# Patient Record
Sex: Female | Born: 1950 | Race: Black or African American | Hispanic: No | Marital: Single | Smoking: Former smoker
Health system: Southern US, Community
[De-identification: ages and names within clinical notes are randomized; demographics above are authoritative.]

## PROBLEM LIST (undated history)

## (undated) DIAGNOSIS — M109 Gout, unspecified: Secondary | ICD-10-CM

## (undated) DIAGNOSIS — I10 Essential (primary) hypertension: Secondary | ICD-10-CM

## (undated) HISTORY — PX: BREAST SURGERY: SHX581

## (undated) HISTORY — PX: TUBAL LIGATION: SHX77

---

## 2016-08-10 ENCOUNTER — Emergency Department (INDEPENDENT_AMBULATORY_CARE_PROVIDER_SITE_OTHER): Payer: Medicare PPO

## 2016-08-10 ENCOUNTER — Emergency Department
Admission: EM | Admit: 2016-08-10 | Discharge: 2016-08-10 | Disposition: A | Discharge status: Home / Self Care | Payer: Medicare PPO | Source: Home / Self Care | Attending: Family Medicine | Admitting: Family Medicine

## 2016-08-10 ENCOUNTER — Encounter: Payer: Self-pay | Admitting: *Deleted

## 2016-08-10 DIAGNOSIS — M79671 Pain in right foot: Secondary | ICD-10-CM

## 2016-08-10 DIAGNOSIS — S99921A Unspecified injury of right foot, initial encounter: Secondary | ICD-10-CM

## 2016-08-10 DIAGNOSIS — L03115 Cellulitis of right lower limb: Secondary | ICD-10-CM

## 2016-08-10 DIAGNOSIS — M25474 Effusion, right foot: Secondary | ICD-10-CM

## 2016-08-10 HISTORY — DX: Gout, unspecified: M10.9

## 2016-08-10 HISTORY — DX: Essential (primary) hypertension: I10

## 2016-08-10 MED ORDER — CEPHALEXIN 500 MG PO CAPS: 500.0000 mg | ORAL_CAPSULE | Freq: Two times a day (BID) | ORAL | 0 refills | Status: DC

## 2016-08-10 NOTE — ED Triage Notes (Signed)
Patient reports dropping an unknown object on the top of her right foot 4 days ago. Redness and swelling present. Taken Advil.

## 2016-08-10 NOTE — ED Provider Notes (Signed)
CSN: [id number]     Arrival date & time 08/10/16  1500 History   First MD Initiated Contact with Patient 08/10/16 1534     Chief Complaint  Patient presents with  . Foot Injury  . Redness/Swelling   (Consider location/radiation/quality/duration/timing/severity/associated sxs/prior Treatment) HPI  Kathleen AcheBarbara Paredez is a 66 y.o. female presenting to UC with c/o Right foot pain for about 4 days after dropping something on it while carrying a few things in her garage. She does not recall what she dropped on it but her foot as been sore ever since then. Pain is aching, mild to moderate in severity, worse with ambulation and palpation.  She has taken Aleve with some relief.  She does have a hx of gout and takes her allopurinol as prescribed.  Current pain does not feel similar to prior gout attacks.  BP is elevated. Hx of same. She takes her BP medication at night. She notes it always gets higher in the doctor's office as she gets anxious when the BP cuff inflates. Denies HA, dizziness or chest pain.    Past Medical History:  Diagnosis Date  . Gout   . Hypertension    Past Surgical History:  Procedure Laterality Date  . BREAST SURGERY    . TUBAL LIGATION     Family History  Problem Relation Age of Onset  . Atrial fibrillation Mother   . Cancer Sister        breast CA   Social History  Substance Use Topics  . Smoking status: Former Games developermoker  . Smokeless tobacco: Never Used  . Alcohol use No   OB History    No data available     Review of Systems  Musculoskeletal: Positive for arthralgias, joint swelling and myalgias. Negative for gait problem.  Skin: Positive for color change. Negative for wound.  Neurological: Negative for weakness and numbness.    Allergies  Penicillins  Home Medications   Prior to Admission medications   Medication Sig Start Date End Date Taking? Authorizing Provider  allopurinol (ZYLOPRIM) 300 MG tablet Take 300 mg by mouth daily.   Yes [provider]  amLODipine (NORVASC) 5 MG tablet Take 5 mg by mouth daily.   Yes [provider]  losartan (COZAAR) 100 MG tablet Take 100 mg by mouth daily.   Yes [provider]  traMADol (ULTRAM) 50 MG tablet Take by mouth every 6 (six) hours as needed.   Yes [provider]  cephALEXin (KEFLEX) 500 MG capsule Take 1 capsule (500 mg total) by mouth 2 (two) times daily. 08/10/16   Junius Finner'Malley, Audrionna Lampton, PA-C   Meds Ordered and Administered this Visit  Medications - No data to display  BP (!) 172/106 (BP Location: Left Arm)   Pulse 77   Wt 191 lb (86.6 kg)   SpO2 100%  No data found.   Physical Exam  Constitutional: She is oriented to person, place, and time. She appears well-developed and well-nourished.  HENT:  Head: Normocephalic and atraumatic.  Eyes: EOM are normal.  Neck: Normal range of motion.  Cardiovascular: Normal rate.   Pulses:      Dorsalis pedis pulses are 2+ on the right side.       Posterior tibial pulses are 2+ on the right side.  Pulmonary/Chest: Effort normal.  Musculoskeletal: Normal range of motion. She exhibits edema and tenderness.  Right foot: mild edema over 3rd-5th metatarsals with tenderness. Full ROM ankle and toes w/o tenderness.   Neurological: She is  alert and oriented to person, place, and time.  Skin: Skin is warm and dry. Capillary refill takes less than 2 seconds. There is erythema.  Right foot: dorsal aspect: erythema and warmth. Skin in tact.   Psychiatric: She has a normal mood and affect. Her behavior is normal.  Nursing note and vitals reviewed.   Urgent Care Course     Procedures (including critical care time)  Labs Review Labs Reviewed - No data to display  Imaging Review Dg Foot Complete Right  Result Date: 08/10/2016 CLINICAL DATA:  Right foot pain and swelling secondary to blunt trauma several days ago. EXAM: RIGHT FOOT COMPLETE - 3+ VIEW COMPARISON:  None. FINDINGS: There is no evidence of fracture or  dislocation. 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 Plantar calcaneal osteophyte. Slight dorsal spurring in the midfoot. IMPRESSION: No acute abnormality.  Degenerative changes as described. Electronically Signed   By: Francene Boyers M.D.   On: 08/10/2016 16:09      MDM   1. Right foot injury, initial encounter   2. Right foot pain   3. Swelling of foot joint, right   4. Cellulitis of right foot    Pt c/o redness, swelling, and soreness of Right foot. Skin is in tact but warm to touch.  Plain films: Negative for acute injury. Question if early cellulitis.   Rx: Keflex  Home care instructions provided. F/u with PCP in 1 week if not improving.     Junius Finner, PA-C 08/10/16 [phone]

## 2016-08-12 ENCOUNTER — Telehealth: Payer: Self-pay | Admitting: Emergency Medicine

## 2016-08-12 NOTE — Telephone Encounter (Signed)
Contacted patient to follow up on visit no answer. Left a message that if patient was not improving to please return or [facility] if needed.

## 2017-10-22 IMAGING — DX DG FOOT COMPLETE 3+V*R*
3 series · 3 of 3 positions shown · non-contrast
Comparison: None.

CLINICAL DATA: Right foot pain and swelling secondary to blunt
trauma several days ago.

EXAM:
RIGHT FOOT COMPLETE - 3+ VIEW

[foot ap]
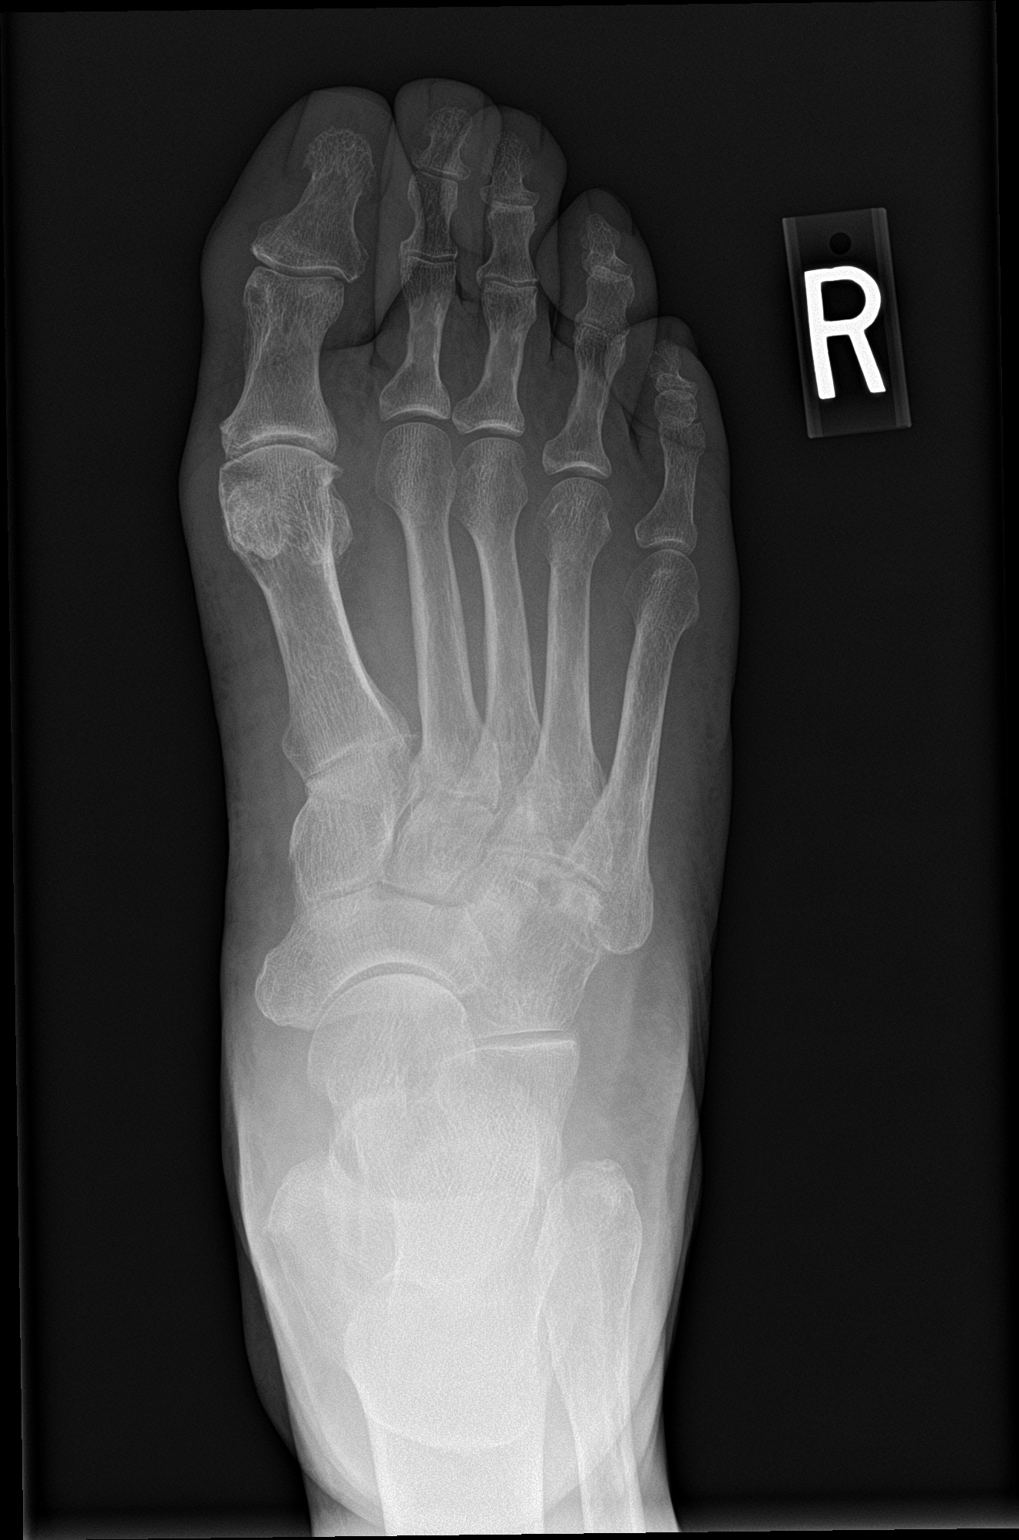

[foot obl]
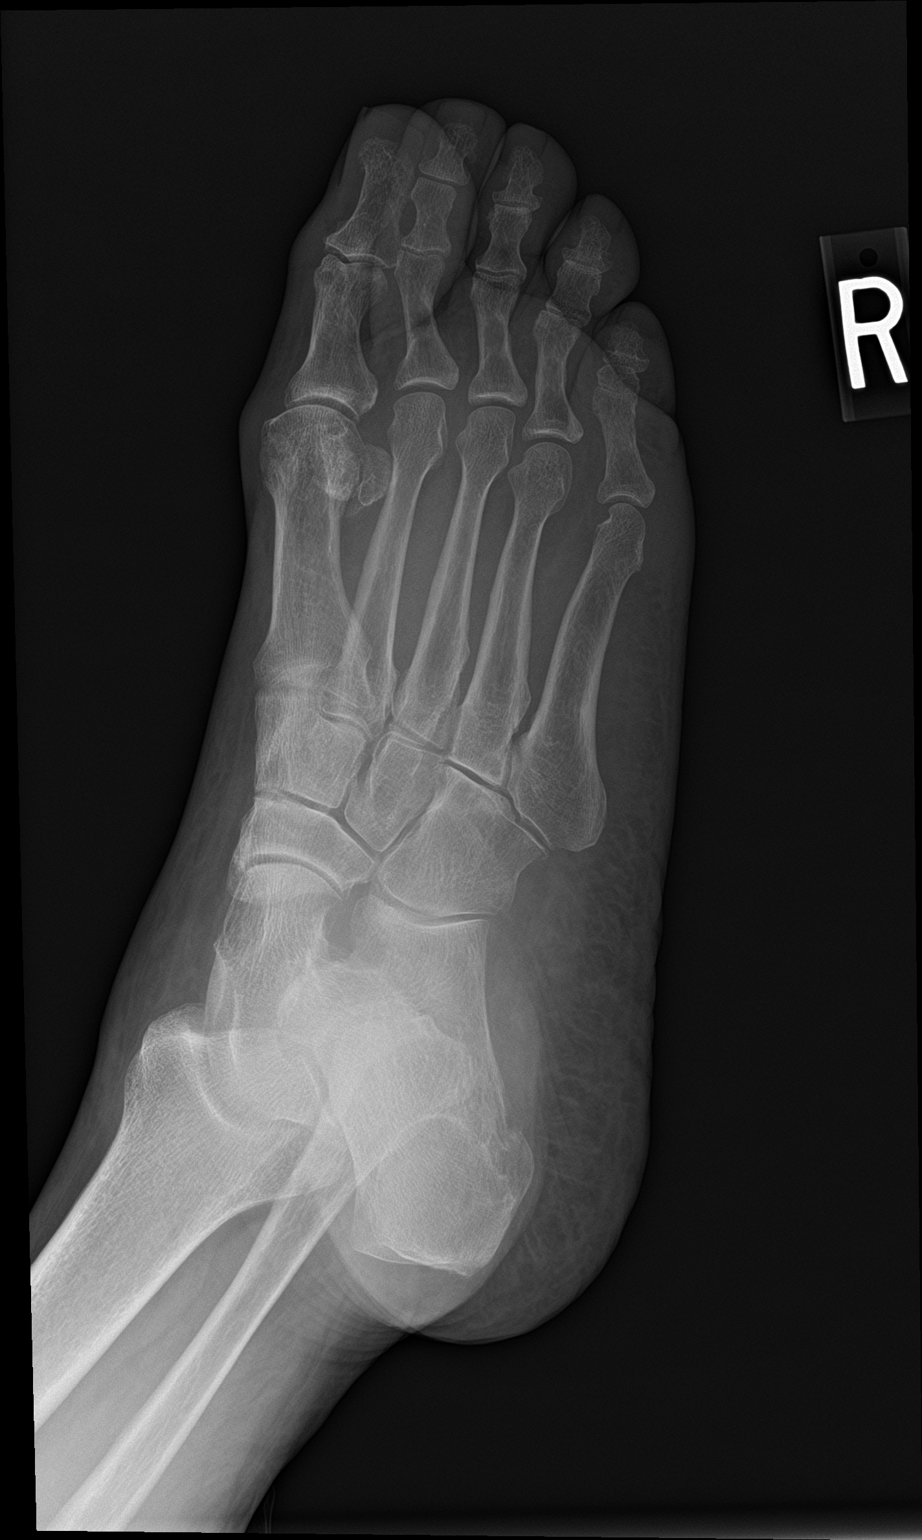

[foot lat]
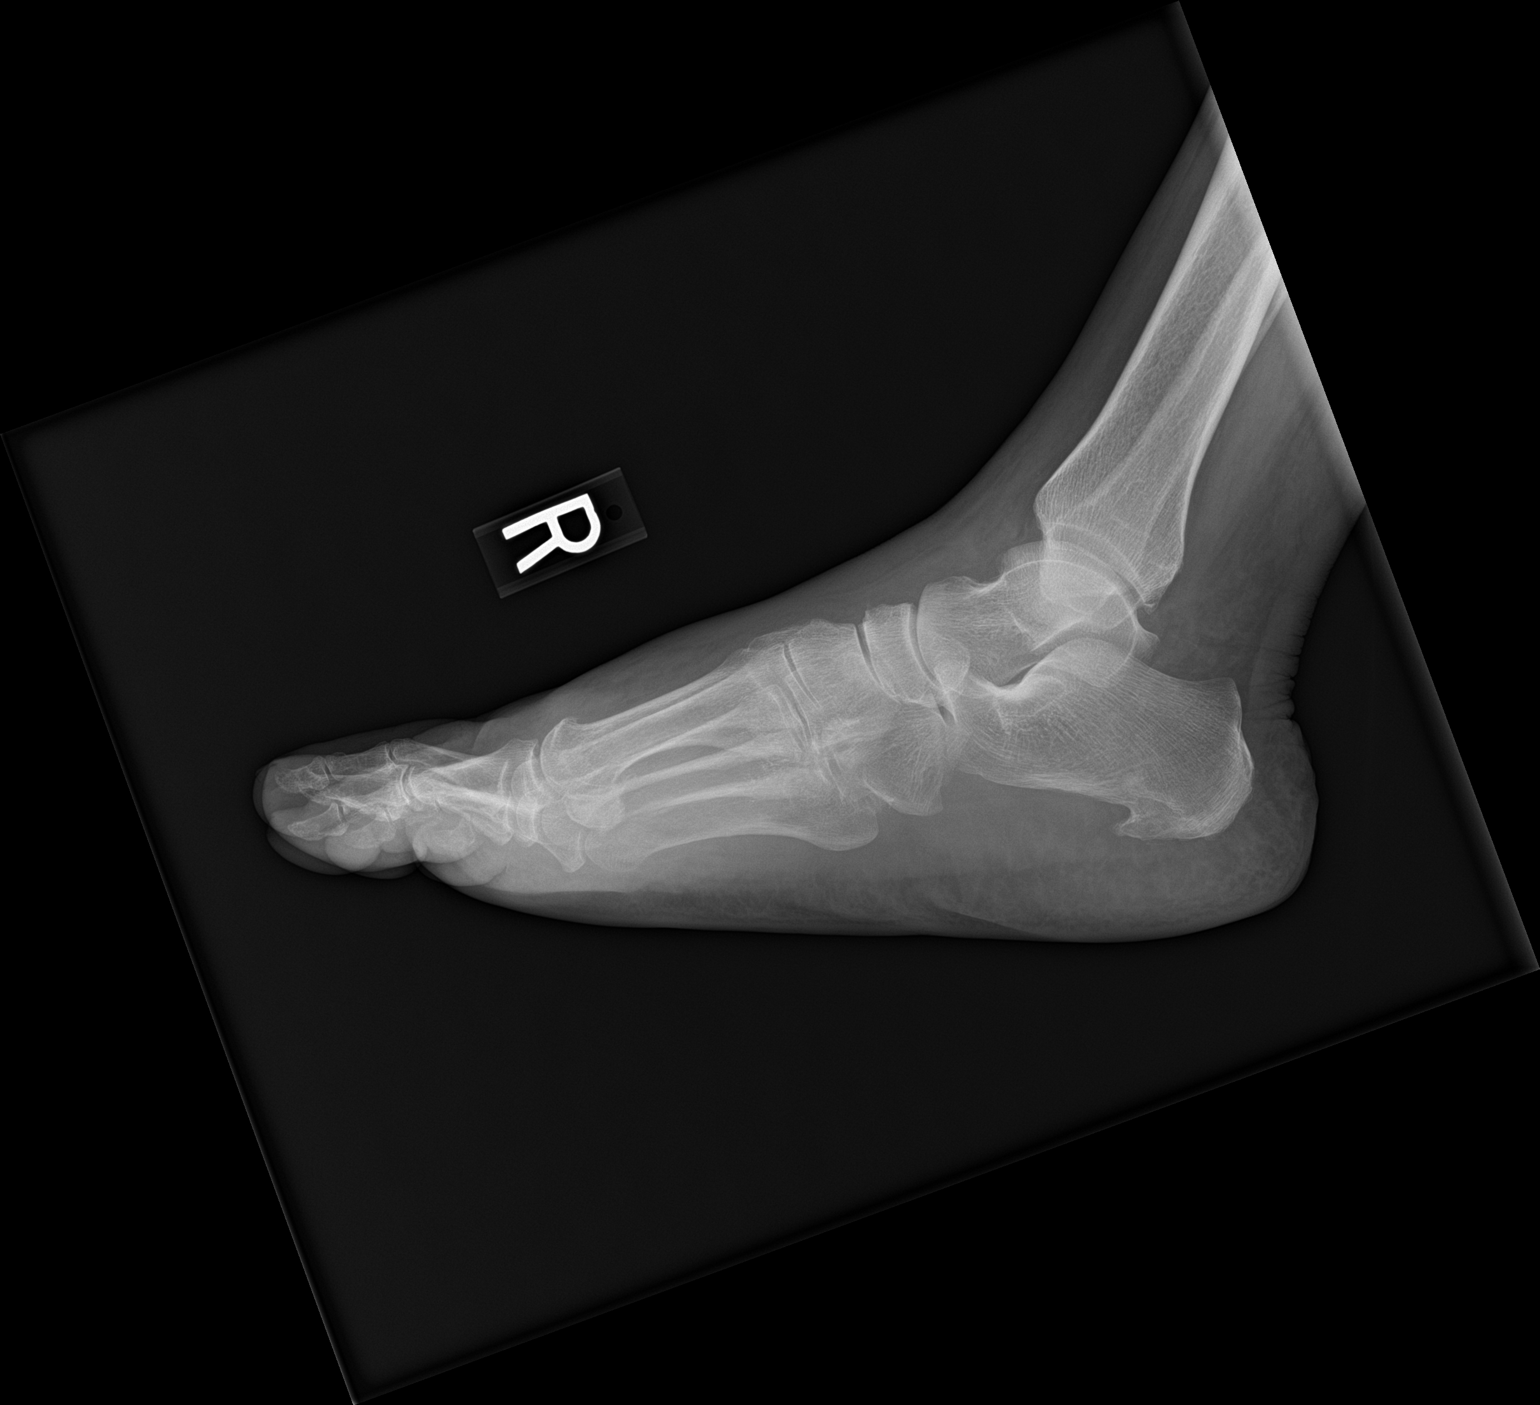

[3 of 3 positions shown; findings below may reference images not displayed]

FINDINGS: There is no evidence of fracture or dislocation. There is
osteoarthritis of the first metatarsal phalangeal joint. Plantar
calcaneal osteophyte. Slight dorsal spurring in the midfoot.
IMPRESSION: No acute abnormality.  Degenerative changes as described.

## 2018-06-06 ENCOUNTER — Other Ambulatory Visit: Payer: Self-pay

## 2018-06-06 ENCOUNTER — Emergency Department
Admission: EM | Admit: 2018-06-06 | Discharge: 2018-06-06 | Disposition: A | Discharge status: Home / Self Care | Payer: Medicare HMO | Source: Home / Self Care | Attending: Family Medicine | Admitting: Family Medicine

## 2018-06-06 DIAGNOSIS — B9789 Other viral agents as the cause of diseases classified elsewhere: Secondary | ICD-10-CM | POA: Diagnosis not present

## 2018-06-06 DIAGNOSIS — J302 Other seasonal allergic rhinitis: Secondary | ICD-10-CM

## 2018-06-06 DIAGNOSIS — J069 Acute upper respiratory infection, unspecified: Secondary | ICD-10-CM

## 2018-06-06 MED ORDER — BENZONATATE 200 MG PO CAPS: ORAL_CAPSULE | ORAL | 0 refills | Status: AC

## 2018-06-06 MED ORDER — PREDNISONE 20 MG PO TABS: ORAL_TABLET | ORAL | 0 refills | Status: AC

## 2018-06-06 MED ORDER — AZITHROMYCIN 250 MG PO TABS: ORAL_TABLET | ORAL | 0 refills | Status: AC

## 2018-06-06 NOTE — Discharge Instructions (Addendum)
Begin prednisone Saturday, 06/07/18. Take plain guaifenesin (1200mg  extended release tabs such as Mucinex) twice daily, with plenty of water, for cough and congestion.  Get adequate rest.   Also recommend using saline nasal spray several times daily and saline nasal irrigation (AYR is a common brand).    Try warm salt water gargles for sore throat.  Stop all antihistamines for now, and other non-prescription cough/cold preparations. May take Delsym Cough Suppressant with Tessalon at bedtime for nighttime cough.  Begin Azithromycin if not improving about one week or if persistent fever develops

## 2018-06-06 NOTE — ED Triage Notes (Signed)
Pt stated that she was working in the yard earlier this week, and started getting congested.  Productive cough at times

## 2018-06-06 NOTE — ED Provider Notes (Signed)
[facility]    CSN: [id number] Arrival date & time: 06/06/18  1643     History   Chief Complaint Chief Complaint  Patient presents with  . Cough    HPI Kathleen Mills is a 68 y.o. female.   After doing yard work last week, patient developed sneezing and nasal congestion that she thought was caused by her seasonal allergies.  She then became hoarse and has developed a persistent cough.  She denies fevers, chills, and sweats and pleuritic pain.  The history is provided by the patient.    Past Medical History:  Diagnosis Date  . Gout   . Hypertension     There are no active problems to display for this patient.   Past Surgical History:  Procedure Laterality Date  . BREAST SURGERY    . TUBAL LIGATION      OB History   No obstetric history on file.      Home Medications    Prior to Admission medications   Medication Sig Start Date End Date Taking? Authorizing Provider  allopurinol (ZYLOPRIM) 300 MG tablet Take 300 mg by mouth daily.    [provider]  amLODipine (NORVASC) 5 MG tablet Take 5 mg by mouth daily.    [provider]  azithromycin (ZITHROMAX Z-PAK) 250 MG tablet Take 2 tabs today; then begin one tab once daily for 4 more days. (Rx void after 06/14/18) 06/06/18   Lattie Haw, MD  benzonatate (TESSALON) 200 MG capsule Take one cap by mouth at bedtime as needed for cough.  May repeat in 4 to 6 hours 06/06/18   Lattie Haw, MD  losartan (COZAAR) 100 MG tablet Take 100 mg by mouth daily.    [provider]  predniSONE (DELTASONE) 20 MG tablet Take one tab by mouth twice daily for 4 days, then one daily for 3 days. Take with food. 06/06/18   Lattie Haw, MD  traMADol (ULTRAM) 50 MG tablet Take by mouth every 6 (six) hours as needed.    [provider]    Family History Family History  Problem Relation Age of Onset  . Atrial fibrillation Mother   . Cancer Sister        breast CA    Social  History Social History   Tobacco Use  . Smoking status: Former Games developer  . Smokeless tobacco: Never Used  Substance Use Topics  . Alcohol use: No  . Drug use: No     Allergies   Penicillins   Review of Systems Review of Systems No sore throat + hoarse + cough No pleuritic pain No wheezing + nasal congestion + post-nasal drainage No sinus pain/pressure No itchy/red eyes No earache No hemoptysis No SOB No fever/chills No nausea No vomiting No abdominal pain No diarrhea No urinary symptoms No skin rash + fatigue No myalgias No headache Used OTC meds without relief   Physical Exam Triage Vital Signs ED Triage Vitals  Enc Vitals Group     BP 06/06/18 1733 (!) 176/93     Pulse Rate 06/06/18 1733 79     Resp 06/06/18 1733 18     Temp 06/06/18 1733 98.5 F (36.9 C)     Temp Source 06/06/18 1733 Oral     SpO2 06/06/18 1733 100 %     Weight 06/06/18 1734 196 lb (88.9 kg)     Height 06/06/18 1734 5\' 5"  (1.651 m)     Head Circumference --  Peak Flow --      Pain Score 06/06/18 1734 0     Pain Loc --      Pain Edu? --      Excl. in GC? --    No data found.  Updated Vital Signs BP (!) 176/93 (BP Location: Right Arm)   Pulse 79   Temp 98.5 F (36.9 C) (Oral)   Resp 18   Ht 5\' 5"  (1.651 m)   Wt 88.9 kg   SpO2 100%   BMI 32.62 kg/m   Visual Acuity Right Eye Distance:   Left Eye Distance:   Bilateral Distance:    Right Eye Near:   Left Eye Near:    Bilateral Near:     Physical Exam Nursing notes and Vital Signs reviewed. Appearance:  Patient appears stated age, and in no acute distress Eyes:  Pupils are equal, round, and reactive to light and accomodation.  Extraocular movement is intact.  Conjunctivae are not inflamed  Ears:  Canals normal.  Tympanic membranes normal.  Nose:  Mildly congested turbinates.  No sinus tenderness.  Pharynx:  Normal Neck:  Supple.  Enlarged posterior/lateral nodes are palpated bilaterally, tender to palpation on  the left.   Lungs:  Clear to auscultation.  Breath sounds are equal.  Moving air well. Heart:  Regular rate and rhythm without murmurs, rubs, or gallops.  Abdomen:  Nontender without masses or hepatosplenomegaly.  Bowel sounds are present.  No CVA or flank tenderness.  Extremities:  No edema.  Skin:  No rash present.    UC Treatments / Results  Labs (all labs ordered are listed, but only abnormal results are displayed) Labs Reviewed - No data to display  EKG None  Radiology No results found.  Procedures Procedures (including critical care time)  Medications Ordered in UC Medications - No data to display  Initial Impression / Assessment and Plan / UC Course  I have reviewed the triage vital signs and the nursing notes.  Pertinent labs & imaging results that were available during my care of the patient were reviewed by me and considered in my medical decision making (see chart for details).    Suspect combination of viral URI and allergic rhinitis.  There is no evidence of bacterial infection today.  Begin prednisone burst/taper. Prescription written for Benzonatate [facility](Tessalon) to take at bedtime for night-time cough.  Followup with Family Doctor if not improved in 10 days.   Final Clinical Impressions(s) / UC Diagnoses   Final diagnoses:  Viral URI with cough  Seasonal allergic rhinitis, unspecified trigger     Discharge Instructions     Begin prednisone Saturday, 06/07/18. Take plain guaifenesin (1200mg  extended release tabs such as Mucinex) twice daily, with plenty of water, for cough and congestion.  Get adequate rest.   Also recommend using saline nasal spray several times daily and saline nasal irrigation (AYR is a common brand).    Try warm salt water gargles for sore throat.  Stop all antihistamines for now, and other non-prescription cough/cold preparations. May take Delsym Cough Suppressant with Tessalon at bedtime for nighttime cough.  Begin Azithromycin if  not improving about one week or if persistent fever develops (Given a prescription to hold, with an expiration date)       ED Prescriptions    Medication Sig Dispense Auth. Provider   predniSONE (DELTASONE) 20 MG tablet Take one tab by mouth twice daily for 4 days, then one daily for 3 days. Take with food. 11 tablet  Lattie Haw, MD   benzonatate (TESSALON) 200 MG capsule Take one cap by mouth at bedtime as needed for cough.  May repeat in 4 to 6 hours 15 capsule Lattie Haw, MD   azithromycin (ZITHROMAX Z-PAK) 250 MG tablet Take 2 tabs today; then begin one tab once daily for 4 more days. (Rx void after 06/14/18) 6 tablet Lattie Haw, MD         Lattie Haw, MD 06/10/18 [phone]

## 2018-06-19 ENCOUNTER — Other Ambulatory Visit: Payer: Self-pay

## 2018-06-19 ENCOUNTER — Emergency Department
Admission: EM | Admit: 2018-06-19 | Discharge: 2018-06-19 | Disposition: A | Discharge status: Home / Self Care | Payer: Medicare HMO | Source: Home / Self Care

## 2018-06-19 DIAGNOSIS — R0982 Postnasal drip: Secondary | ICD-10-CM | POA: Diagnosis not present

## 2018-06-19 DIAGNOSIS — R059 Cough, unspecified: Secondary | ICD-10-CM

## 2018-06-19 DIAGNOSIS — R05 Cough: Secondary | ICD-10-CM

## 2018-06-19 MED ORDER — CETIRIZINE HCL 5 MG PO TABS: 5.0000 mg | ORAL_TABLET | Freq: Every day | ORAL | 0 refills | Status: AC

## 2018-06-19 MED ORDER — IPRATROPIUM BROMIDE 0.06 % NA SOLN: 2.0000 | Freq: Four times a day (QID) | NASAL | 1 refills | Status: AC

## 2018-06-19 NOTE — ED Provider Notes (Signed)
[facility]    CSN: [id number] Arrival date & time: 06/19/18  1315     History   Chief Complaint Chief Complaint  Patient presents with  . Cough    HPI Kathleen Mills is a 68 y.o. female.   HPI  Kathleen Mills is a 68 y.o. female presenting to UC with c/o mild intermittent minimally productive cough that started 2-3 weeks ago. [facility], dx with a viral URI and allergic rhinitis but was prescribed azithromycin, prednisone and tessalon to start if symptoms worsened. She completed the medication but states the cough has lingered. Denies fever, chills, chest pain or SOB. Cough started after she was working out in her yard.  She does have a hx of allergies but does not take any allergy medication.    Past Medical History:  Diagnosis Date  . Gout   . Hypertension     There are no active problems to display for this patient.   Past Surgical History:  Procedure Laterality Date  . BREAST SURGERY    . TUBAL LIGATION      OB History   No obstetric history on file.      Home Medications    Prior to Admission medications   Medication Sig Start Date End Date Taking? Authorizing Provider  allopurinol (ZYLOPRIM) 300 MG tablet Take 300 mg by mouth daily.    [provider]  amLODipine (NORVASC) 5 MG tablet Take 5 mg by mouth daily.    [provider]  azithromycin (ZITHROMAX Z-PAK) 250 MG tablet Take 2 tabs today; then begin one tab once daily for 4 more days. (Rx void after 06/14/18) 06/06/18   Lattie HawBeese, Stephen A, MD  benzonatate (TESSALON) 200 MG capsule Take one cap by mouth at bedtime as needed for cough.  May repeat in 4 to 6 hours 06/06/18   Lattie HawBeese, Stephen A, MD  cetirizine (ZYRTEC) 5 MG tablet Take 1 tablet (5 mg total) by mouth daily. 06/19/18   Lurene ShadowPhelps, Doshia Dalia O, PA-C  ipratropium (ATROVENT) 0.06 % nasal spray Place 2 sprays into both nostrils 4 (four) times daily. 06/19/18   Lurene ShadowPhelps, Marrion Accomando O, PA-C  losartan (COZAAR) 100 MG tablet  Take 100 mg by mouth daily.    [provider]  predniSONE (DELTASONE) 20 MG tablet Take one tab by mouth twice daily for 4 days, then one daily for 3 days. Take with food. 06/06/18   Lattie HawBeese, Stephen A, MD  traMADol (ULTRAM) 50 MG tablet Take by mouth every 6 (six) hours as needed.    [provider]    Family History Family History  Problem Relation Age of Onset  . Atrial fibrillation Mother   . Cancer Sister        breast CA    Social History Social History   Tobacco Use  . Smoking status: Former Games developermoker  . Smokeless tobacco: Never Used  Substance Use Topics  . Alcohol use: No  . Drug use: No     Allergies   Penicillins   Review of Systems Review of Systems  Constitutional: Negative for chills and fever.  HENT: Positive for congestion and postnasal drip. Negative for ear pain, rhinorrhea, sinus pressure, sinus pain, sore throat, trouble swallowing and voice change.   Respiratory: Positive for cough. Negative for shortness of breath.   Cardiovascular: Negative for chest pain and palpitations.  Gastrointestinal: Negative for abdominal pain, diarrhea, nausea and vomiting.  Musculoskeletal: Negative for arthralgias, back pain and myalgias.  Skin: Negative for rash.     Physical Exam Triage Vital Signs ED Triage Vitals [06/19/18 1337]  Enc Vitals Group     BP (!) 165/92     Pulse Rate 75     Resp 18     Temp 98.4 F (36.9 C)     Temp Source Oral     SpO2 96 %     Weight      Height      Head Circumference      Peak Flow      Pain Score 0     Pain Loc      Pain Edu?      Excl. in GC?    No data found.  Updated Vital Signs BP (!) 165/92 (BP Location: Right Arm)   Pulse 75   Temp 98.4 F (36.9 C) (Oral)   Resp 18   SpO2 96%   Visual Acuity Right Eye Distance:   Left Eye Distance:   Bilateral Distance:    Right Eye Near:   Left Eye Near:    Bilateral Near:     Physical Exam Vitals signs and nursing note reviewed.   Constitutional:      Appearance: Normal appearance. She is well-developed.  HENT:     Head: Normocephalic and atraumatic.     Right Ear: Tympanic membrane normal.     Left Ear: Tympanic membrane normal.     Nose: Nose normal.     Right Sinus: No maxillary sinus tenderness or frontal sinus tenderness.     Left Sinus: No maxillary sinus tenderness or frontal sinus tenderness.     Mouth/Throat:     Lips: Pink.     Mouth: Mucous membranes are moist.     Pharynx: Oropharynx is clear. Uvula midline.  Neck:     Musculoskeletal: Normal range of motion.  Cardiovascular:     Rate and Rhythm: Normal rate and regular rhythm.  Pulmonary:     Effort: Pulmonary effort is normal. No respiratory distress.     Breath sounds: Normal breath sounds. No stridor. No wheezing or rhonchi.  Musculoskeletal: Normal range of motion.  Skin:    General: Skin is warm and dry.  Neurological:     Mental Status: She is alert and oriented to person, place, and time.  Psychiatric:        Behavior: Behavior normal.      UC Treatments / Results  Labs (all labs ordered are listed, but only abnormal results are displayed) Labs Reviewed - No data to display  EKG None  Radiology No results found.  Procedures Procedures (including critical care time)  Medications Ordered in UC Medications - No data to display  Initial Impression / Assessment and Plan / UC Course  I have reviewed the triage vital signs and the nursing notes.  Pertinent labs & imaging results that were available during my care of the patient were reviewed by me and considered in my medical decision making (see chart for details).     Hx and exam most c/w allergic rhinitis, cough likely due to post-nasal drip No coughing during exam, however pt cleared throat a few times No evidence of bacterial infection at this time F/u with PCP as needed    Final Clinical Impressions(s) / UC Diagnoses   Final diagnoses:  Post-nasal drip  Cough      Discharge Instructions      Please take the allergy medication as prescribed and be sure to follow up with  your primary care provider/family doctor for ongoing healthcare needs including persistent cough and allergies.  For the Ipratropium nasal spray, be sure to use as prescribed to help prevent post-nasal drip, which can trigger coughing, especially at night.  Use 2 sprays per nostril 4 times daily while sick.  You should spray one time in each nostril pointing the spray to the out portion of your nostril, breath in slowly while spraying. Wait about 30 seconds to 1 minute before given the second spray in each nostril.  This will ensure you get the most benefit from each spray.       ED Prescriptions    Medication Sig Dispense Auth. Provider   ipratropium (ATROVENT) 0.06 % nasal spray Place 2 sprays into both nostrils 4 (four) times daily. 15 mL Doroteo Glassman, Tiyona Desouza O, PA-C   cetirizine (ZYRTEC) 5 MG tablet Take 1 tablet (5 mg total) by mouth daily. 30 tablet Lurene Shadow, PA-C     Controlled Substance Prescriptions [place] Controlled Substance Registry consulted? Not Applicable   Rolla Plate 06/19/18 7001

## 2018-06-19 NOTE — ED Triage Notes (Signed)
Pt c/o lingering cough that started about 06/04/18. Was seen 06/06/18 and given a zpack, prednisone and tessalon. Requesting refills due to continued cough.

## 2018-06-19 NOTE — Discharge Instructions (Signed)
°  Please take the allergy medication as prescribed and be sure to follow up with your primary care provider/family doctor for ongoing healthcare needs including persistent cough and allergies.  For the Ipratropium nasal spray, be sure to use as prescribed to help prevent post-nasal drip, which can trigger coughing, especially at night.  Use 2 sprays per nostril 4 times daily while sick.  You should spray one time in each nostril pointing the spray to the out portion of your nostril, breath in slowly while spraying. Wait about 30 seconds to 1 minute before given the second spray in each nostril.  This will ensure you get the most benefit from each spray.
